# Patient Record
Sex: Female | Born: 1945 | Race: White | Hispanic: No | Smoking: Never smoker
Health system: Southern US, Community
[De-identification: ages and names within clinical notes are randomized; demographics above are authoritative.]

## PROBLEM LIST (undated history)

## (undated) DIAGNOSIS — F419 Anxiety disorder, unspecified: Secondary | ICD-10-CM

## (undated) DIAGNOSIS — K219 Gastro-esophageal reflux disease without esophagitis: Secondary | ICD-10-CM

## (undated) HISTORY — DX: Gastro-esophageal reflux disease without esophagitis: K21.9

## (undated) HISTORY — DX: Anxiety disorder, unspecified: F41.9

---

## 2013-09-23 ENCOUNTER — Encounter: Payer: Self-pay | Admitting: Internal Medicine

## 2013-09-23 ENCOUNTER — Ambulatory Visit (INDEPENDENT_AMBULATORY_CARE_PROVIDER_SITE_OTHER): Payer: Medicare Other | Admitting: Internal Medicine

## 2013-09-23 VITALS — BP 114/70 | HR 88 | Temp 97.6°F | Resp 18 | Ht 65.75 in | Wt 100.0 lb

## 2013-09-23 DIAGNOSIS — K219 Gastro-esophageal reflux disease without esophagitis: Secondary | ICD-10-CM

## 2013-09-23 DIAGNOSIS — F0390 Unspecified dementia without behavioral disturbance: Secondary | ICD-10-CM

## 2013-09-23 DIAGNOSIS — K5900 Constipation, unspecified: Secondary | ICD-10-CM

## 2013-09-23 DIAGNOSIS — R634 Abnormal weight loss: Secondary | ICD-10-CM

## 2013-09-23 LAB — COMPREHENSIVE METABOLIC PANEL
ALBUMIN: 3.8 g/dL (ref 3.5–5.2)
ALT: 11 U/L (ref 0–35)
AST: 19 U/L (ref 0–37)
Alkaline Phosphatase: 101 U/L (ref 39–117)
BUN: 17 mg/dL (ref 6–23)
CALCIUM: 9.8 mg/dL (ref 8.4–10.5)
CO2: 27 meq/L (ref 19–32)
CREATININE: 0.61 mg/dL (ref 0.50–1.10)
Chloride: 100 mEq/L (ref 96–112)
Glucose, Bld: 108 mg/dL — ABNORMAL HIGH (ref 70–99)
POTASSIUM: 3.5 meq/L (ref 3.5–5.3)
Sodium: 137 mEq/L (ref 135–145)
Total Bilirubin: 0.4 mg/dL (ref 0.3–1.2)
Total Protein: 7 g/dL (ref 6.0–8.3)

## 2013-09-23 LAB — CBC WITH DIFFERENTIAL/PLATELET
BASOS PCT: 0 % (ref 0–1)
Basophils Absolute: 0 10*3/uL (ref 0.0–0.1)
EOS ABS: 0.1 10*3/uL (ref 0.0–0.7)
Eosinophils Relative: 1 % (ref 0–5)
HCT: 31.6 % — ABNORMAL LOW (ref 36.0–46.0)
Hemoglobin: 10.9 g/dL — ABNORMAL LOW (ref 12.0–15.0)
Lymphocytes Relative: 18 % (ref 12–46)
Lymphs Abs: 1.1 10*3/uL (ref 0.7–4.0)
MCH: 30.6 pg (ref 26.0–34.0)
MCHC: 34.5 g/dL (ref 30.0–36.0)
MCV: 88.8 fL (ref 78.0–100.0)
Monocytes Absolute: 0.5 10*3/uL (ref 0.1–1.0)
Monocytes Relative: 9 % (ref 3–12)
Neutro Abs: 4.3 10*3/uL (ref 1.7–7.7)
Neutrophils Relative %: 72 % (ref 43–77)
PLATELETS: 359 10*3/uL (ref 150–400)
RBC: 3.56 MIL/uL — AB (ref 3.87–5.11)
RDW: 12.7 % (ref 11.5–15.5)
WBC: 6 10*3/uL (ref 4.0–10.5)

## 2013-09-23 NOTE — Progress Notes (Signed)
Pre-visit discussion using our clinic review tool. No additional management support is needed unless otherwise documented below in the visit note.  

## 2013-09-23 NOTE — Progress Notes (Signed)
Subjective:    Patient ID: Laurie Greene, female    DOB: [birth year], 68 y.o.   MRN: [id number]  HPI  68 year old patient who is seen today as a work in. She is also seen today to establish as a new patient. She is accompanied by 2 daughters who are quite concerned about her mental and physical condition. She has a long history of the cognitive impairment which has progressed since the death of her husband in 02/06/12. Patient has become much more confused and at times are quite paranoid. There's been a significant weight loss.  She lives in [place] and apparently a chest x-ray was performed on December 31. She has multiple medications all of which have been held.  Past medical history  Cognitive impairment Migraine headaches Allergic rhinitis Gastroesophageal reflux disease Constipation Weight loss  Surgical history  Status post cholecystectomy Status post hysterectomy  Family history positive for alcoholism lung cancer dyslipidemia and hypertension  Current medications have included cefpodoxime, Vicodin Protriptyline Zonisamide imitrex linzess dexilant  Review of Systems  Constitutional: Positive for activity change, appetite change, fatigue and unexpected weight change. Negative for fever.  HENT: Negative for congestion, dental problem, ear pain, hearing loss, mouth sores, nosebleeds, sinus pressure, sore throat, tinnitus, trouble swallowing and voice change.   Eyes: Negative for photophobia, pain, redness and visual disturbance.  Respiratory: Positive for cough. Negative for chest tightness and shortness of breath.   Cardiovascular: Negative for chest pain, palpitations and leg swelling.  Gastrointestinal: Negative for nausea, vomiting, abdominal pain, diarrhea, constipation, blood in stool, abdominal distention and rectal pain.  Genitourinary: Negative for dysuria, urgency, frequency, hematuria, flank pain, vaginal bleeding, vaginal discharge, difficulty urinating, genital  sores, vaginal pain, menstrual problem and pelvic pain.  Musculoskeletal: Negative for arthralgias, back pain and neck stiffness.  Skin: Negative for rash.  Neurological: Negative for dizziness, syncope, speech difficulty, weakness, light-headedness, numbness and headaches.  Hematological: Negative for adenopathy. Does not bruise/bleed easily.  Psychiatric/Behavioral: Positive for behavioral problems, confusion and decreased concentration. Negative for suicidal ideas, self-injury, dysphoric mood and agitation. The patient is not nervous/anxious.        Objective:   Physical Exam  Constitutional: She is oriented to person, place, and time. She appears well-developed.  Quite thin almost emaciated with evidence of muscle wasting  HENT:  Head: Normocephalic and atraumatic.  Right Ear: External ear normal.  Left Ear: External ear normal.  Mouth/Throat: Oropharynx is clear and moist.  Eyes: Conjunctivae and EOM are normal.  Neck: Normal range of motion. Neck supple. No JVD present. No thyromegaly present.  Cardiovascular: Normal rate, regular rhythm, normal heart sounds and intact distal pulses.   No murmur heard. Pulmonary/Chest: Effort normal and breath sounds normal. She has no wheezes. She has no rales.  Abdominal: Soft. Bowel sounds are normal. She exhibits no distension and no mass. There is no tenderness. There is no rebound and no guarding.  Suggestion of mild hepatomegaly   Genitourinary: Vagina normal.  Musculoskeletal: Normal range of motion. She exhibits no edema and no tenderness.  Neurological: She is alert and oriented to person, place, and time. She has normal reflexes. No cranial nerve deficit. She exhibits normal muscle tone. Coordination normal.  Plantar responses flexor  Skin: Skin is warm and dry. No rash noted.  Psychiatric: She has a normal mood and affect. Her behavior is normal.          Assessment & Plan:   Cognitive impairment ( normal MMSE 29/30)  all  medications on hold History weight loss/ protein calorie malnutrition Constipation History recent cough and normal chest x-ray Migraine headaches GERD  Will review lab including a dementia workup. Consider imaging studies on Monday.

## 2013-09-24 LAB — VITAMIN B12: Vitamin B-12: 972 pg/mL — ABNORMAL HIGH (ref 211–911)

## 2013-09-24 LAB — T4, FREE: Free T4: 1.33 ng/dL (ref 0.80–1.80)

## 2013-09-24 LAB — SEDIMENTATION RATE: SED RATE: 69 mm/h — AB (ref 0–22)

## 2013-09-24 LAB — TSH: TSH: 0.559 u[IU]/mL (ref 0.350–4.500)

## 2013-09-26 ENCOUNTER — Telehealth: Payer: Self-pay | Admitting: Internal Medicine

## 2013-09-26 ENCOUNTER — Other Ambulatory Visit: Payer: Self-pay | Admitting: Internal Medicine

## 2013-09-26 DIAGNOSIS — F03918 Unspecified dementia, unspecified severity, with other behavioral disturbance: Secondary | ICD-10-CM

## 2013-09-26 DIAGNOSIS — F0391 Unspecified dementia with behavioral disturbance: Secondary | ICD-10-CM

## 2013-09-26 NOTE — Telephone Encounter (Signed)
Pt's son-in-law, who is not listed as designated party, calling to request appointment that PCP requested for imaging study to be done tomorrow after 10am instead of today.  However, there is no order enterered in chart for imaging study to be done today.  Please review and advise.

## 2013-09-27 ENCOUNTER — Ambulatory Visit
Admission: RE | Admit: 2013-09-27 | Discharge: 2013-09-27 | Disposition: A | Discharge status: Home / Self Care | Payer: Medicare Other | Source: Ambulatory Visit | Attending: Internal Medicine | Admitting: Internal Medicine

## 2013-09-27 ENCOUNTER — Ambulatory Visit (HOSPITAL_COMMUNITY)
Admission: RE | Admit: 2013-09-27 | Discharge: 2013-09-27 | Disposition: A | Discharge status: Home / Self Care | Payer: Medicare Other | Source: Ambulatory Visit | Attending: Internal Medicine | Admitting: Internal Medicine

## 2013-09-27 ENCOUNTER — Other Ambulatory Visit: Payer: Self-pay | Admitting: Internal Medicine

## 2013-09-27 ENCOUNTER — Encounter: Payer: Self-pay | Admitting: Internal Medicine

## 2013-09-27 ENCOUNTER — Encounter: Payer: Self-pay | Admitting: Cardiovascular Disease

## 2013-09-27 ENCOUNTER — Ambulatory Visit (INDEPENDENT_AMBULATORY_CARE_PROVIDER_SITE_OTHER): Payer: Medicare Other | Admitting: Internal Medicine

## 2013-09-27 ENCOUNTER — Ambulatory Visit (HOSPITAL_COMMUNITY): Admission: RE | Admit: 2013-09-27 | Payer: Medicare Other | Source: Ambulatory Visit

## 2013-09-27 VITALS — BP 114/70 | HR 89 | Temp 97.4°F | Resp 18 | Ht 65.75 in | Wt 100.0 lb

## 2013-09-27 DIAGNOSIS — F0391 Unspecified dementia with behavioral disturbance: Secondary | ICD-10-CM

## 2013-09-27 DIAGNOSIS — R634 Abnormal weight loss: Secondary | ICD-10-CM

## 2013-09-27 DIAGNOSIS — R51 Headache: Secondary | ICD-10-CM | POA: Insufficient documentation

## 2013-09-27 DIAGNOSIS — F03918 Unspecified dementia, unspecified severity, with other behavioral disturbance: Secondary | ICD-10-CM

## 2013-09-27 DIAGNOSIS — J323 Chronic sphenoidal sinusitis: Secondary | ICD-10-CM

## 2013-09-27 DIAGNOSIS — F0390 Unspecified dementia without behavioral disturbance: Secondary | ICD-10-CM

## 2013-09-27 DIAGNOSIS — R413 Other amnesia: Secondary | ICD-10-CM | POA: Insufficient documentation

## 2013-09-27 DIAGNOSIS — F29 Unspecified psychosis not due to a substance or known physiological condition: Secondary | ICD-10-CM | POA: Insufficient documentation

## 2013-09-27 DIAGNOSIS — J3489 Other specified disorders of nose and nasal sinuses: Secondary | ICD-10-CM | POA: Insufficient documentation

## 2013-09-27 MED ORDER — LEVOFLOXACIN 500 MG PO TABS: 500.0000 mg | ORAL_TABLET | Freq: Every day | ORAL | Status: DC

## 2013-09-27 MED ORDER — DEXLANSOPRAZOLE 60 MG PO CPDR: 60.0000 mg | DELAYED_RELEASE_CAPSULE | Freq: Every day | ORAL | Status: AC

## 2013-09-27 MED ORDER — LINACLOTIDE 290 MCG PO CAPS: 290.0000 ug | ORAL_CAPSULE | Freq: Every day | ORAL | Status: AC

## 2013-09-27 NOTE — Progress Notes (Signed)
   Subjective:    Patient ID: Laurie Greene, female    DOB: 05/18/1946, 68 y.o.   MRN: [id number]  HPI  68 year old patient who is status with our practice recently who returns today for followup. She presented with weight loss  And concerns of worsening confusion.  As part of a dementia workup a head CT without contrast was obtained today that revealed extensive left sphenoid sinus disease. The patient has had a history of sinus disease in the past and apparently sinus surgery. She does describe some chronic rhinorrhea but denies any focal pain; she has been treated for migraine headaches however.  All her medications except Dexilant and linzess have been on hold. There have been some concerns about polypharmacy being a factor with her mental status change. An MMSE  revealed a score of 29/30. Her weight is stable today at 100 pounds. She is quite anxious about returning home but is agreeable to returning in 4 weeks for followup. Laboratory screen revealed an elevated sedimentation rate and mild anemia  Wt Readings from Last 3 Encounters:  09/27/13 100 lb (45.36 kg)  09/23/13 100 lb (45.36 kg)    Review of Systems  Constitutional: Positive for appetite change and unexpected weight change.  HENT: Positive for postnasal drip. Negative for congestion, dental problem, hearing loss, rhinorrhea, sinus pressure, sore throat and tinnitus.   Eyes: Negative for pain, discharge and visual disturbance.  Respiratory: Negative for cough and shortness of breath.   Cardiovascular: Negative for chest pain, palpitations and leg swelling.  Gastrointestinal: Negative for nausea, vomiting, abdominal pain, diarrhea, constipation, blood in stool and abdominal distention.  Genitourinary: Negative for dysuria, urgency, frequency, hematuria, flank pain, vaginal bleeding, vaginal discharge, difficulty urinating, vaginal pain and pelvic pain.  Musculoskeletal: Negative for arthralgias, gait problem and joint swelling.  Skin:  Negative for rash.  Neurological: Negative for dizziness, syncope, speech difficulty, weakness, numbness and headaches.  Hematological: Negative for adenopathy.  Psychiatric/Behavioral: Negative for behavioral problems, dysphoric mood and agitation. The patient is not nervous/anxious.        Objective:   Physical Exam  Constitutional: She appears well-developed and well-nourished. No distress.  Psychiatric: She has a normal mood and affect.  Appears alert with a bright affect          Assessment & Plan:  Left sphenoid sinusitis.  This could well explain her entire clinical syndrome with anorexia elevated sedimentation rate and mild anemia.  The patient has a penicillin allergy and has been treated recently with cefpodoxime- will treat with a three-week course of Levaquin and re assess in 4 weeks. We'll recheck her CBC and sed rate at that time as well as her general status Weight loss Possible polypharmacy History of mental status changes Normal MMSE and neuro examination; head CT unremarkable except for sinus disease Elevated sedimentation rate- possibly related to sinus disease;  Doubt PMR; doubt pseudo-depression GERD Chronic constipation

## 2013-09-27 NOTE — Progress Notes (Signed)
Pre-visit discussion using our clinic review tool. No additional management support is needed unless otherwise documented below in the visit note.  

## 2013-09-27 NOTE — Patient Instructions (Signed)
Use saline irrigation, warm  moist compresses and over-the-counter decongestants only as directed.  Call if there is no improvement in 5 to 7 days, or sooner if you develop increasing pain, fever, or any new symptoms.  Take your antibiotic as prescribed until ALL of it is gone, but stop if you develop a rash, swelling, or any side effects of the medication.  Contact our office as soon as possible if  there are side effects of the medication.  Return in one month for followup

## 2013-10-24 ENCOUNTER — Ambulatory Visit (INDEPENDENT_AMBULATORY_CARE_PROVIDER_SITE_OTHER): Payer: Medicare Other | Admitting: Internal Medicine

## 2013-10-24 ENCOUNTER — Encounter: Payer: Self-pay | Admitting: Internal Medicine

## 2013-10-24 VITALS — BP 92/60 | HR 73 | Temp 97.6°F | Resp 18 | Ht 65.75 in | Wt 99.0 lb

## 2013-10-24 DIAGNOSIS — R350 Frequency of micturition: Secondary | ICD-10-CM

## 2013-10-24 DIAGNOSIS — IMO0001 Reserved for inherently not codable concepts without codable children: Secondary | ICD-10-CM

## 2013-10-24 DIAGNOSIS — D649 Anemia, unspecified: Secondary | ICD-10-CM | POA: Insufficient documentation

## 2013-10-24 DIAGNOSIS — R634 Abnormal weight loss: Secondary | ICD-10-CM

## 2013-10-24 LAB — POCT URINALYSIS DIPSTICK
Bilirubin, UA: NEGATIVE
Blood, UA: NEGATIVE
Glucose, UA: NEGATIVE
Ketones, UA: NEGATIVE
Leukocytes, UA: NEGATIVE
Nitrite, UA: NEGATIVE
PROTEIN UA: NEGATIVE
Spec Grav, UA: 1.015
UROBILINOGEN UA: 0.2
pH, UA: 8.5

## 2013-10-24 LAB — CBC WITH DIFFERENTIAL/PLATELET
BASOS ABS: 0 10*3/uL (ref 0.0–0.1)
Basophils Relative: 0.6 % (ref 0.0–3.0)
EOS ABS: 0 10*3/uL (ref 0.0–0.7)
Eosinophils Relative: 1.6 % (ref 0.0–5.0)
HCT: 37.1 % (ref 36.0–46.0)
Hemoglobin: 12.1 g/dL (ref 12.0–15.0)
Lymphocytes Relative: 42.3 % (ref 12.0–46.0)
Lymphs Abs: 1.3 10*3/uL (ref 0.7–4.0)
MCHC: 32.6 g/dL (ref 30.0–36.0)
MCV: 95.8 fl (ref 78.0–100.0)
Monocytes Absolute: 0.3 10*3/uL (ref 0.1–1.0)
Monocytes Relative: 10.9 % (ref 3.0–12.0)
NEUTROS PCT: 44.6 % (ref 43.0–77.0)
Neutro Abs: 1.4 10*3/uL (ref 1.4–7.7)
Platelets: 115 10*3/uL — ABNORMAL LOW (ref 150.0–400.0)
RBC: 3.87 Mil/uL (ref 3.87–5.11)
RDW: 15.4 % — AB (ref 11.5–14.6)
WBC: 3 10*3/uL — ABNORMAL LOW (ref 4.5–10.5)

## 2013-10-24 LAB — SEDIMENTATION RATE: Sed Rate: 8 mm/hr (ref 0–22)

## 2013-10-24 NOTE — Progress Notes (Signed)
Pre-visit discussion using our clinic review tool. No additional management support is needed unless otherwise documented below in the visit note.  

## 2013-10-24 NOTE — Patient Instructions (Signed)
.  Call or return to clinic prn if these symptoms worsen or fail to improve as anticipated.  Return in 6 months for follow-up  

## 2013-10-24 NOTE — Progress Notes (Signed)
Subjective:    Patient ID: Laurie DeisSusan Greene, female    DOB: 04/20/1946, 68 y.o.   MRN: [id number]  HPI  68 year old patient who is seen today in followup. She was seen one month ago and felt to have subacute sphenoid sinusitis and has completed 3 weeks of Levaquin. The patient has a penicillin allergy. She feels much improved. There has been some concerns about weight loss but her weight has been stable. There have also been concerns about early dementia but her mental status seems to be at baseline.  She feels well today.  Complete antibiotic therapy 2 days ago. She states her headaches are much improved. She still has some mild left-sided rhinorrhea. No recent fever She continues to have chronic constipation  Wt Readings from Last 3 Encounters:  10/24/13 99 lb (44.906 kg)  09/27/13 100 lb (45.36 kg)  09/23/13 100 lb (45.36 kg)   History reviewed. No pertinent past medical history.  History   Social History  . Marital Status: Widowed    Spouse Name: N/A    Number of Children: N/A  . Years of Education: N/A   Occupational History  . Not on file.   Social History Main Topics  . Smoking status: Never Smoker   . Smokeless tobacco: Never Used  . Alcohol Use: No  . Drug Use: No  . Sexual Activity: Not on file   Other Topics Concern  . Not on file   Social History Narrative  . No narrative on file    History reviewed. No pertinent past surgical history.  No family history on file.  Allergies  Allergen Reactions  . Codeine Nausea And Vomiting    Current Outpatient Prescriptions on File Prior to Visit  Medication Sig Dispense Refill  . dexlansoprazole (DEXILANT) 60 MG capsule Take 1 capsule (60 mg total) by mouth daily.  30 capsule    . Linaclotide (LINZESS) 290 MCG CAPS capsule Take 1 capsule (290 mcg total) by mouth daily.  30 capsule     No current facility-administered medications on file prior to visit.    BP 92/60  Pulse 73  Temp(Src) 97.6 F (36.4 C) (Oral)   Resp 18  Ht 5' 5.75" (1.67 m)  Wt 99 lb (44.906 kg)  BMI 16.10 kg/m2  SpO2 98%    Review of Systems  Constitutional: Positive for unexpected weight change.  HENT: Positive for postnasal drip. Negative for congestion, dental problem, hearing loss, rhinorrhea, sinus pressure, sore throat and tinnitus.   Eyes: Negative for pain, discharge and visual disturbance.  Respiratory: Negative for cough and shortness of breath.   Cardiovascular: Negative for chest pain, palpitations and leg swelling.  Gastrointestinal: Negative for nausea, vomiting, abdominal pain, diarrhea, constipation, blood in stool and abdominal distention.  Genitourinary: Negative for dysuria, urgency, frequency, hematuria, flank pain, vaginal bleeding, vaginal discharge, difficulty urinating, vaginal pain and pelvic pain.  Musculoskeletal: Negative for arthralgias, gait problem and joint swelling.  Skin: Negative for rash.  Neurological: Positive for headaches. Negative for dizziness, syncope, speech difficulty, weakness and numbness.  Hematological: Negative for adenopathy.  Psychiatric/Behavioral: Negative for behavioral problems, dysphoric mood and agitation. The patient is not nervous/anxious.        Objective:   Physical Exam  Constitutional: She is oriented to person, place, and time. She appears well-developed and well-nourished.  HENT:  Head: Normocephalic.  Right Ear: External ear normal.  Left Ear: External ear normal.  Mouth/Throat: Oropharynx is clear and moist.  Eyes: Conjunctivae and EOM are normal.  Pupils are equal, round, and reactive to light.  Neck: Normal range of motion. Neck supple. No thyromegaly present.  Cardiovascular: Normal rate, regular rhythm, normal heart sounds and intact distal pulses.   Pulmonary/Chest: Effort normal and breath sounds normal.  Abdominal: Soft. Bowel sounds are normal. She exhibits no mass. There is no tenderness.  Musculoskeletal: Normal range of motion.  Walks with a  cane  Lymphadenopathy:    She has no cervical adenopathy.  Neurological: She is alert and oriented to person, place, and time.  Skin: Skin is warm and dry. No rash noted.  Psychiatric: She has a normal mood and affect. Her behavior is normal. Judgment and thought content normal.  Bright affect          Assessment & Plan:   History weight loss. This appears to be stable. We'll continue to observe clinically Subacute sinusitis. Patient has completed 3 weeks of antibiotic therapy; we'll check a CBC and sed rate History of confusion. Probably related to polypharmacy  Recheck when necessary or in 6 months

## 2017-09-26 ENCOUNTER — Other Ambulatory Visit: Payer: Self-pay | Admitting: Neurological Surgery

## 2017-09-26 DIAGNOSIS — S32020A Wedge compression fracture of second lumbar vertebra, initial encounter for closed fracture: Secondary | ICD-10-CM

## 2017-10-01 ENCOUNTER — Ambulatory Visit
Admission: RE | Admit: 2017-10-01 | Discharge: 2017-10-01 | Disposition: A | Discharge status: Home / Self Care | Payer: Medicare Other | Source: Ambulatory Visit | Attending: Neurological Surgery | Admitting: Neurological Surgery

## 2017-10-01 DIAGNOSIS — S32020A Wedge compression fracture of second lumbar vertebra, initial encounter for closed fracture: Secondary | ICD-10-CM

## 2017-10-19 ENCOUNTER — Other Ambulatory Visit: Payer: Self-pay

## 2017-10-21 ENCOUNTER — Ambulatory Visit
Admission: RE | Admit: 2017-10-21 | Discharge: 2017-10-21 | Disposition: A | Discharge status: Home / Self Care | Payer: Medicare Other | Source: Ambulatory Visit | Attending: Neurological Surgery | Admitting: Neurological Surgery

## 2017-10-21 ENCOUNTER — Inpatient Hospital Stay
Admission: RE | Admit: 2017-10-21 | Discharge: 2017-10-21 | Disposition: A | Discharge status: Home / Self Care | Payer: Self-pay | Source: Ambulatory Visit | Attending: Neurological Surgery | Admitting: Neurological Surgery

## 2017-10-21 DIAGNOSIS — S32020A Wedge compression fracture of second lumbar vertebra, initial encounter for closed fracture: Secondary | ICD-10-CM

## 2017-10-29 ENCOUNTER — Encounter (HOSPITAL_BASED_OUTPATIENT_CLINIC_OR_DEPARTMENT_OTHER): Payer: Medicare Other

## 2017-11-05 ENCOUNTER — Ambulatory Visit: Payer: Medicare Other | Admitting: Endocrinology

## 2017-11-05 ENCOUNTER — Encounter: Payer: Self-pay | Admitting: Endocrinology

## 2017-11-05 DIAGNOSIS — K219 Gastro-esophageal reflux disease without esophagitis: Secondary | ICD-10-CM | POA: Diagnosis not present

## 2017-11-05 DIAGNOSIS — F419 Anxiety disorder, unspecified: Secondary | ICD-10-CM | POA: Diagnosis not present

## 2017-11-05 DIAGNOSIS — M810 Age-related osteoporosis without current pathological fracture: Secondary | ICD-10-CM

## 2017-11-05 LAB — BASIC METABOLIC PANEL
BUN: 19 mg/dL (ref 6–23)
CALCIUM: 9.7 mg/dL (ref 8.4–10.5)
CO2: 33 meq/L — AB (ref 19–32)
CREATININE: 0.67 mg/dL (ref 0.40–1.20)
Chloride: 102 mEq/L (ref 96–112)
GFR: 92.01 mL/min (ref >60.00)
Glucose, Bld: 95 mg/dL (ref 70–99)
Potassium: 4.4 mEq/L (ref 3.5–5.1)
Sodium: 139 mEq/L (ref 135–145)

## 2017-11-05 LAB — T4, FREE: Free T4: 1.01 ng/dL (ref 0.60–1.60)

## 2017-11-05 LAB — VITAMIN D 25 HYDROXY (VIT D DEFICIENCY, FRACTURES): VITD: 29.98 ng/mL — ABNORMAL LOW (ref 30.00–100.00)

## 2017-11-05 LAB — TSH: TSH: 1.63 u[IU]/mL (ref 0.35–4.50)

## 2017-11-05 NOTE — Patient Instructions (Signed)
blood tests are requested for you today.  We'll let you know about the results. Based on the results, I would probably recommend a once a year infusion for the osteoporosis.

## 2017-11-05 NOTE — Progress Notes (Signed)
Subjective:    Patient ID: Laurie Greene, female    DOB: 04/05/1946, 72 y.o.   MRN: [id number]  HPI Pt is referred by Dr Cyndy Freeze, for osteoporosis.  Pt was noted to have osteoporosis in 2018.  She has never been on medication for this. She had TAH and BSO in 1989.  She has no history of any of the following: multiple myeloma, renal dz, thyroid problems, prolonged bedrest, steroids, alcoholism, smoking, liver dz, vid-d deficiency, or primary hyperparathyroidism.  She does not take heparin or anticonvulsants.  She fx left hip and L-4 in 2018, both with falls.  She fx L-1 in early 2019, but cause is uncertain.  She has moderate pain at the lower back, and assoc nausea.  Hx is from dtr, due to dementia.  She lives at [facility].   Past Medical History:  Diagnosis Date  . Anxiety   . GERD (gastroesophageal reflux disease)     No past surgical history on file.  Social History   Socioeconomic History  . Marital status: Widowed    Spouse name: Not on file  . Number of children: Not on file  . Years of education: Not on file  . Highest education level: Not on file  Social Needs  . Financial resource strain: Not on file  . Food insecurity - worry: Not on file  . Food insecurity - inability: Not on file  . Transportation needs - medical: Not on file  . Transportation needs - non-medical: Not on file  Occupational History  . Not on file  Tobacco Use  . Smoking status: Never Smoker  . Smokeless tobacco: Never Used  Substance and Sexual Activity  . Alcohol use: No  . Drug use: No  . Sexual activity: Not on file  Other Topics Concern  . Not on file  Social History Narrative  . Not on file    Current Outpatient Medications on File Prior to Visit  Medication Sig Dispense Refill  . busPIRone (BUSPAR) 5 MG tablet Take 5 mg by mouth 3 (three) times daily.    Marland Kitchen dexlansoprazole (DEXILANT) 60 MG capsule Take 1 capsule (60 mg total) by mouth daily. 30 capsule   . diclofenac (VOLTAREN) 75 MG EC  tablet Take 75 mg by mouth 2 (two) times daily.    Marland Kitchen donepezil (ARICEPT) 10 MG tablet Take 10 mg by mouth at bedtime.    . fluticasone (FLONASE) 50 MCG/ACT nasal spray Place 1 spray into both nostrils daily.    Marland Kitchen ibuprofen (ADVIL,MOTRIN) 600 MG tablet Take 600 mg by mouth every 6 (six) hours as needed.    . Linaclotide (LINZESS) 290 MCG CAPS capsule Take 1 capsule (290 mcg total) by mouth daily. 30 capsule   . Melatonin 2.5 MG CAPS Take 5 mg by mouth daily.    . Polyethylene Glycol 3350-GRX POWD Take by mouth as needed (one cap full by mouth with water twice a day as needed.).    Marland Kitchen Probiotic Product (PROBIOTIC DAILY PO) Take 1 tablet by mouth daily.    . sertraline (ZOLOFT) 25 MG tablet Take 50 mg by mouth daily.    Marland Kitchen tiotropium (SPIRIVA) 18 MCG inhalation capsule Place 18 mcg into inhaler and inhale daily.    Marland Kitchen tolterodine (DETROL LA) 4 MG 24 hr capsule Take 4 mg by mouth daily.     No current facility-administered medications on file prior to visit.     Allergies  Allergen Reactions  . Codeine Nausea And Vomiting  Family History  Problem Relation Age of Onset  . Osteoporosis Mother     BP (!) 88/60 (BP Location: Right Arm, Patient Position: Sitting, Cuff Size: Normal)   Pulse 70     Review of Systems denies weight loss, hematuria, edema, skin rash, insomnia, cramps, and rhinorrhea.  She has easy bruising, cold intolerance, and memory loss.  Heartburn is well-controlled     Objective:   Physical Exam VS: see vs page GEN: no distress.  In wheelchair HEAD: head: no deformity eyes: no periorbital swelling, no proptosis external nose and ears are normal. mouth: no lesion seen. NECK: supple, thyroid is not enlarged.  CHEST WALL: no deformity.  Back brace is present LUNGS: clear to auscultation CV: reg rate and rhythm, no murmur.  ABD: abdomen is soft, nontender.   not distended.   MUSCULOSKELETAL: muscle bulk and strength are grossly normal.  no obvious joint swelling.     EXTEMITIES: no deformity.  no edema PULSES: no carotid bruit NEURO:  cn 2-12 grossly intact.   readily moves all 4's.  sensation is intact to touch on all 4's SKIN:  Normal texture and temperature.  No rash or suspicious lesion is visible.   NODES:  None palpable at the neck PSYCH: alert, oriented to self only.  Does not appear anxious nor depressed.    DEXA: Femur T-score of -2.8 Lumbar spine was not utilized due to history of compression fractures of L1-L4. Left femur was excluded due to surgical hardware. Patient does not meet criteria for FRAX assessment.  CT: acute or subacute appearing L1 compression fracture with vertebral body height loss anteriorly of up to 60%. No involvement of the posterior elements is identified. Bony retropulsion off the superior endplate of L1 causes mild central canal narrowing at T12-L1. Fractions appearance most consistent with a senile osteoporotic injury.  Remote L4 compression fracture. Bony retropulsion off the superior endplate of L4 causes moderate central canal narrowing. The foramina appear open.  Lab Results  Component Value Date   CREATININE 0.67 11/05/2017   BUN 19 11/05/2017   NA 139 11/05/2017   K 4.4 11/05/2017   CL 102 11/05/2017   CO2 33 (H) 11/05/2017   I have reviewed outside records, and summarized: Pt was noted to have osteoporosis, and referred here.  kyphoplasty is planned.      Assessment & Plan:  Osteoporosis, new to me, poss due to BSO at a young age.   Dementia: this increases fall risk.   GERD: this is poss exac by NSAID, but she has limits pain rx options, so we'll work around that.    Patient Instructions  blood tests are requested for you today.  We'll let you know about the results. Based on the results, I would probably recommend a once a year infusion for the osteoporosis.

## 2017-11-07 ENCOUNTER — Encounter: Payer: Self-pay | Admitting: Endocrinology

## 2017-11-07 DIAGNOSIS — K219 Gastro-esophageal reflux disease without esophagitis: Secondary | ICD-10-CM | POA: Insufficient documentation

## 2017-11-07 DIAGNOSIS — F419 Anxiety disorder, unspecified: Secondary | ICD-10-CM | POA: Insufficient documentation

## 2017-11-09 ENCOUNTER — Telehealth: Payer: Self-pay

## 2017-11-09 LAB — PROTEIN ELECTROPHORESIS, SERUM
ALPHA 2: 0.6 g/dL (ref 0.5–0.9)
Albumin ELP: 4.3 g/dL (ref 3.8–4.8)
Alpha 1: 0.3 g/dL (ref 0.2–0.3)
BETA 2: 0.3 g/dL (ref 0.2–0.5)
BETA GLOBULIN: 0.5 g/dL (ref 0.4–0.6)
GAMMA GLOBULIN: 0.7 g/dL — AB (ref 0.8–1.7)
TOTAL PROTEIN: 6.6 g/dL (ref 6.1–8.1)

## 2017-11-09 LAB — PTH, INTACT AND CALCIUM
Calcium: 9.5 mg/dL (ref 8.6–10.4)
PTH: 41 pg/mL (ref 14–64)

## 2017-11-09 NOTE — Telephone Encounter (Signed)
Please schedule pt for reclast

## 2017-11-09 NOTE — Telephone Encounter (Signed)
-----   Message from Romero BellingSean Ellison, MD sent at 11/05/2017  6:14 PM EST ----- please call patient's facility and dtr; Vit-d is borderline low. Take calcium 1200 mg per day, and vitamin-D, 1000 units per day Also, please schedule reclast, and I need reclast form, thank you.

## 2017-11-23 ENCOUNTER — Ambulatory Visit: Payer: Medicare Other

## 2017-11-24 ENCOUNTER — Ambulatory Visit (INDEPENDENT_AMBULATORY_CARE_PROVIDER_SITE_OTHER): Payer: Medicare Other | Admitting: Endocrinology

## 2017-11-24 ENCOUNTER — Encounter: Payer: Medicare Other | Admitting: Nutrition

## 2017-11-24 DIAGNOSIS — M810 Age-related osteoporosis without current pathological fracture: Secondary | ICD-10-CM

## 2017-12-04 NOTE — Progress Notes (Signed)
Per Dr. George HughEllison's telephone encounter  note on 11/09/17, and after patient signed to consent form, an IV was started in the patients right arm with a  20g needle.  Normal saline was infused to determine that the IV was patent, and then 5mg  Reclast was started at 11:20AM , and infused until 11:50 AM.  She reported no symptoms of discomfort during this process. We discussed the need to take Calcium and  Vit. D as directed by Dr. Lucianne MussKumar, and she was encourage to drink at least 6 8oz glasses today.  She agreed to do this. The IV was then flussed with more normal saline and then D/Ced.  The site showed no signs of redness or swelling.

## 2017-12-04 NOTE — Patient Instructions (Signed)
Drink at least 6  8 ounce glasses of water today. Continue to take your calcium and vit. D as directed by Dr. Everardo AllEllison

## 2018-05-25 ENCOUNTER — Telehealth: Payer: Self-pay | Admitting: Internal Medicine

## 2018-05-25 NOTE — Telephone Encounter (Signed)
Copied from CRM [id number]. Topic: General - Other >> May 25, 2018 10:18 AM Elliot Gault wrote: Caller name: Darreld Mclean  Relation to pt: referral from [facility] Call back number: [phone]    Reason for call:  Inquiring if PCP will be attending physician for patient, please advise

## 2018-05-25 NOTE — Telephone Encounter (Signed)
Please advise 

## 2018-05-26 NOTE — Telephone Encounter (Signed)
[facility] made aware of Dr.Kwiakowski's retirement.  No Further action needed!

## 2018-05-26 NOTE — Telephone Encounter (Signed)
No due to imminent retirement

## 2018-06-15 DEATH — deceased

## 2019-01-06 IMAGING — CT CT L SPINE W/O CM
1 of 7 series · 6 of 14 positions shown, 8 images · non-contrast
Comparison: None.

CLINICAL DATA: Closed compression fracture of L2 by plain films
performed at [HOSPITAL] [REDACTED] 09/25/2017. The patient
has remote L4 compression fracture where she is status post
vertebral augmentation. Initial encounter.

EXAM:
CT LUMBAR SPINE WITHOUT CONTRAST
TECHNIQUE: Multidetector CT imaging of the lumbar spine was performed without
intravenous contrast administration. Multiplanar CT image
reconstructions were also generated.

[Series 3: l spine soft · axial · 0.27mm/px · z∈[-268,-73]mm · 6 of 93 slices shown, 8 images]
[im 14/93  soft-tissue]
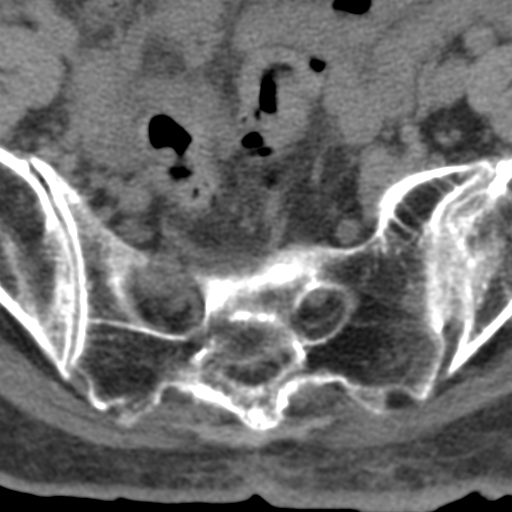
[im 14/93  bone]
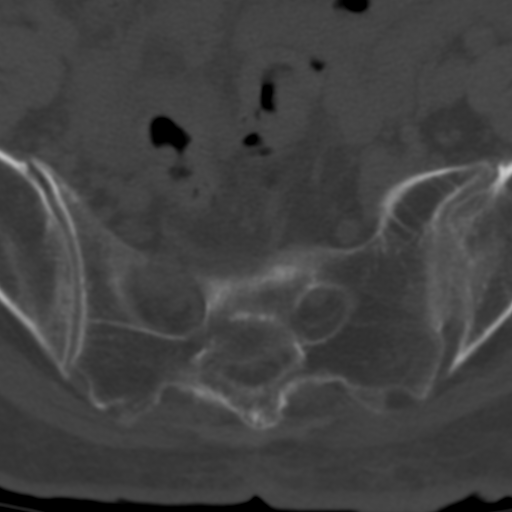
[im 27/93  bone]
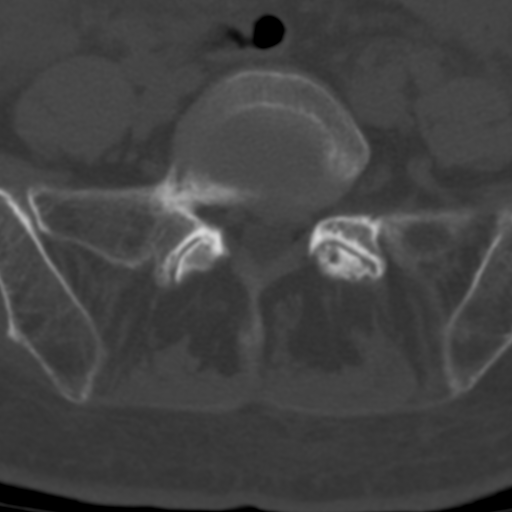
[im 40/93  bone]
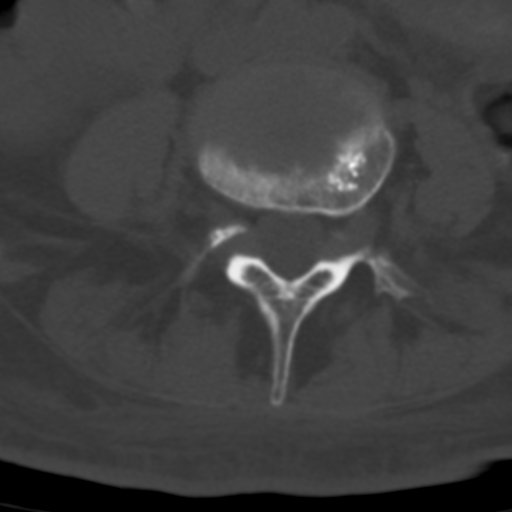
[im 53/93  bone]
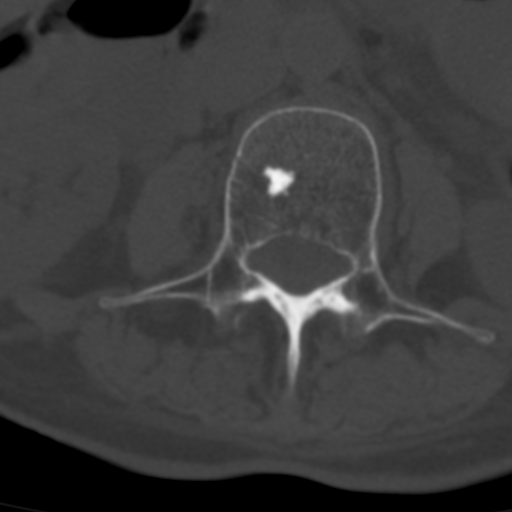
[im 66/93  soft-tissue]
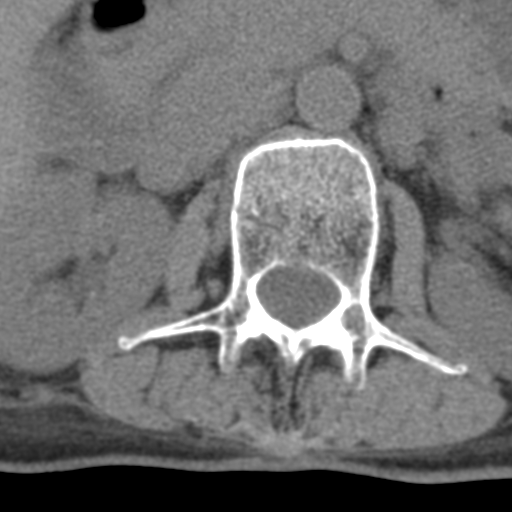
[im 66/93  bone]
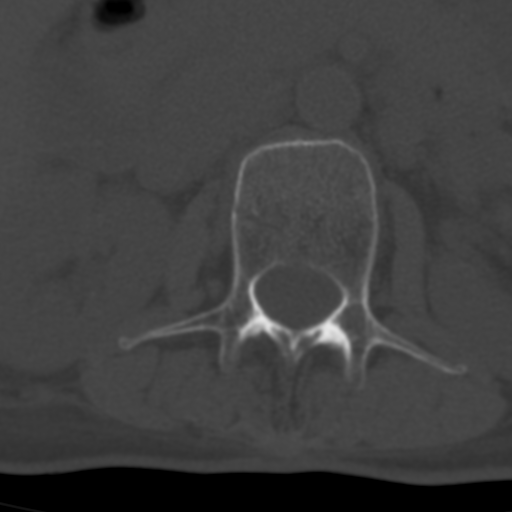
[im 79/93  bone]
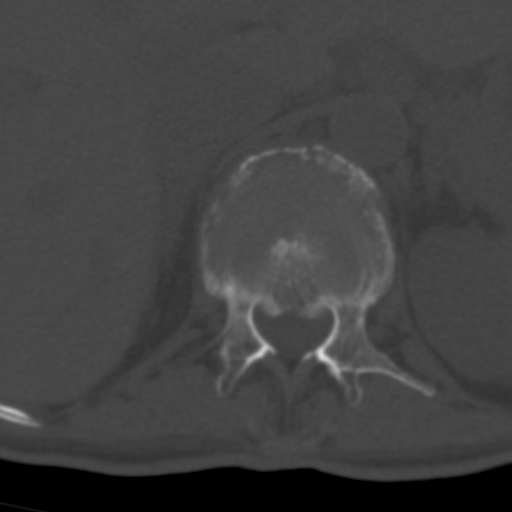

[6 of 14 positions shown; findings below may reference images not displayed]

FINDINGS: Segmentation: Standard.

Alignment: There is mild kyphosis about the T12-L1 level due to the
patient's compression fracture.

Vertebrae: The patient has a superior endplate compression fracture
of L1. Vertebral body height loss is greatest anteriorly where it is
approximately 60%. There is some bony retropulsion off the superior
endplate of L1. The patient's fracture does not involve posterior
elements. No other acute fracture is present. Remote L4 compression
fracture with postoperative change of vertebroplasty noted.

Paraspinal and other soft tissues: Imaged intra-abdominal contents
are unremarkable.

Disc levels: T11-12: Negative.

T12-L1: There is some bony retropulsion off the superior endplate of
L1 mildly indenting the ventral thecal sac. The foramina are open.

L1-2: Negative.

L2-3: Moderate facet degenerative change, ligamentum flavum
thickening and a shallow disc bulge are identified. The central
canal and neural foramina appear open.

L3-4: Moderate facet arthropathy is present. There is a shallow disc
bulge and some bony retropulsion off the superior endplate of L4.
Ligamentum flavum thickening is seen. Moderate central canal
narrowing is identified. The foramina are open.

L4-5: The facet joints appears somewhat widened suggestive of facet
joint effusions. There is a shallow disc bulge and some ligamentum
flavum thickening. Mild central canal and bilateral foraminal
narrowing is present.

L5-S1: The patient has a left paracentral disc protrusion. The disc
contacts the descending left S1 root in the subarticular recess but
the root does not appear compressed. The foramina are open. Facet
arthropathy noted.
IMPRESSION: Acute or subacute appearing L1 compression fracture with vertebral
body height loss anteriorly of up to 60%. No involvement of the
posterior elements is identified. Bony retropulsion off the superior
endplate of L1 causes mild central canal narrowing at T12-L1.
Fractions appearance most consistent with a senile osteoporotic
injury.

Remote L4 compression fracture. Bony retropulsion off the superior
endplate of L4 causes moderate central canal narrowing. The foramina
appear open.

Left paracentral protrusion at L5-S1 contacts the descending left S1
root but the root does not appear compressed.

Mild central canal narrowing at L4-5 where there is a shallow disc
bulge. The facet joints are somewhat widened at this level
suggestive of facet joint effusions.

## 2020-01-27 ENCOUNTER — Telehealth: Payer: Self-pay

## 2020-01-27 NOTE — Telephone Encounter (Signed)
Pt has not been seen since last Reclast infusion on 11/24/17. Would you like pt to be seen and continue Reclast?

## 2020-01-27 NOTE — Telephone Encounter (Signed)
LMTCB to set up appt.

## 2020-01-27 NOTE — Telephone Encounter (Signed)
Please refer to Dr. Ellison's response 

## 2020-01-27 NOTE — Telephone Encounter (Signed)
Please schedule f/u appt.

## 2020-02-08 ENCOUNTER — Telehealth: Payer: Self-pay

## 2020-02-08 NOTE — Telephone Encounter (Addendum)
Spoke with daughter Joice Lofts and her mom has been deceased for a year and a half-can you please route this to the appropriate person

## 2020-02-08 NOTE — Telephone Encounter (Signed)
Called pt's daughter and left detailed voicemail requesting a call back to inform this office as to whether the pt will be returning to see Dr. Everardo All or not.
